# Patient Record
Sex: Male | Born: 1975 | Race: Black or African American | Hispanic: No | Marital: Married | Smoking: Never smoker
Health system: Southern US, Community
[De-identification: ages and names within clinical notes are randomized; demographics above are authoritative.]

## PROBLEM LIST (undated history)

## (undated) ENCOUNTER — Emergency Department (HOSPITAL_COMMUNITY): Discharge status: Absent without leave | Payer: Self-pay

## (undated) DIAGNOSIS — I10 Essential (primary) hypertension: Secondary | ICD-10-CM

## (undated) DIAGNOSIS — R001 Bradycardia, unspecified: Secondary | ICD-10-CM

## (undated) HISTORY — DX: Bradycardia, unspecified: R00.1

## (undated) HISTORY — DX: Essential (primary) hypertension: I10

---

## 2000-03-13 ENCOUNTER — Emergency Department (HOSPITAL_COMMUNITY): Admission: EM | Admit: 2000-03-13 | Discharge: 2000-03-13 | Payer: Self-pay

## 2012-10-25 ENCOUNTER — Other Ambulatory Visit: Payer: Self-pay | Admitting: Family Medicine

## 2012-10-25 ENCOUNTER — Ambulatory Visit (INDEPENDENT_AMBULATORY_CARE_PROVIDER_SITE_OTHER): Payer: 59 | Admitting: Family Medicine

## 2012-10-25 VITALS — BP 143/91 | HR 54 | Temp 98.1°F | Resp 16 | Ht 66.5 in | Wt 180.6 lb

## 2012-10-25 DIAGNOSIS — R1030 Lower abdominal pain, unspecified: Secondary | ICD-10-CM

## 2012-10-25 DIAGNOSIS — R109 Unspecified abdominal pain: Secondary | ICD-10-CM

## 2012-10-25 DIAGNOSIS — Z0000 Encounter for general adult medical examination without abnormal findings: Secondary | ICD-10-CM

## 2012-10-25 LAB — POCT CBC
Granulocyte percent: 57.9 % (ref 37–80)
Hemoglobin: 16 g/dL (ref 14.1–18.1)
Lymph, poc: 1.7 (ref 0.6–3.4)
MCV: 71.9 fL — AB (ref 80–97)
MID (cbc): 0.4 (ref 0–0.9)
MPV: 9.6 fL (ref 0–99.8)
POC Granulocyte: 2.8 (ref 2–6.9)
POC LYMPH PERCENT: 34.5 % (ref 10–50)
POC MID %: 7.6 % (ref 0–12)
RDW, POC: 15.4 %
WBC: 4.8 10*3/uL (ref 4.6–10.2)

## 2012-10-25 LAB — LIPID PANEL
Cholesterol: 183 mg/dL (ref 0–200)
HDL: 49 mg/dL (ref >39)
LDL Cholesterol: 118 mg/dL — ABNORMAL HIGH (ref 0–99)
Total CHOL/HDL Ratio: 3.7 Ratio
Triglycerides: 81 mg/dL (ref <150)
VLDL: 16 mg/dL (ref 0–40)

## 2012-10-25 LAB — COMPREHENSIVE METABOLIC PANEL WITH GFR
AST: 24 U/L (ref 0–37)
Albumin: 4.9 g/dL (ref 3.5–5.2)
Alkaline Phosphatase: 64 U/L (ref 39–117)
Glucose, Bld: 98 mg/dL (ref 70–99)
Potassium: 5.3 meq/L (ref 3.5–5.3)
Sodium: 140 meq/L (ref 135–145)
Total Bilirubin: 0.5 mg/dL (ref 0.3–1.2)
Total Protein: 7.4 g/dL (ref 6.0–8.3)

## 2012-10-25 LAB — COMPREHENSIVE METABOLIC PANEL
ALT: 24 U/L (ref 0–53)
BUN: 11 mg/dL (ref 6–23)
CO2: 28 mEq/L (ref 19–32)
Calcium: 9.3 mg/dL (ref 8.4–10.5)
Chloride: 103 mEq/L (ref 96–112)
Creat: 1.05 mg/dL (ref 0.50–1.35)

## 2012-10-25 NOTE — Progress Notes (Signed)
 [facility]:  Office Visit  Chief Complaint:  Chief Complaint  Patient presents with  . Annual Exam    HPI: Bill Morgan is a 37 y.o. male who complains of  Here for PE. Last week had a pulled  Right groin or hernia but it went away but hurts just a little bit. He was working at [employer], Research scientist (medical). Was like a dull pain, now gone, was constant. Wants to make sure he has no hernias  No other sxs, complaints. At work BP is always pre-hypertensive.  Past Medical History  Diagnosis Date  . Sinus bradycardia     asymptomatic, he has always had pulses from our office in the low 50s ( 53-54)   History reviewed. No pertinent past surgical history. History   Social History  . Marital Status: Single    Spouse Name: N/A    Number of Children: N/A  . Years of Education: N/A   Social History Main Topics  . Smoking status: Never Smoker   . Smokeless tobacco: None  . Alcohol Use: Yes  . Drug Use: No  . Sexually Active: Yes    Birth Control/ Protection: IUD   Other Topics Concern  . None   Social History Narrative  . None   History reviewed. No pertinent family history. No Known Allergies Prior to Admission medications   Not on File     ROS: The patient denies fevers, chills, night sweats, unintentional weight loss, chest pain, palpitations, wheezing, dyspnea on exertion, nausea, vomiting, abdominal pain, dysuria, hematuria, melena, numbness, weakness, or tingling.   All other systems have been reviewed and were otherwise negative with the exception of those mentioned in the HPI and as above.    PHYSICAL EXAM: Filed Vitals:   10/25/12 1121  BP: 143/91  Pulse: 54  Temp: 98.1 F (36.7 C)  Resp: 16   Filed Vitals:   10/25/12 1121  Height: 5' 6.5" (1.689 m)  Weight: 180 lb 9.6 oz (81.92 kg)   Body mass index is 28.72 kg/(m^2).  General: Alert, no acute distress HEENT:  Normocephalic, atraumatic, oropharynx patent. EOMI, PERRLA, fundoscpic  exam Cardiovascular:  Regular rate and rhythm, no rubs murmurs or gallops.  No Carotid bruits, radial pulse intact. No pedal edema.  Respiratory: Clear to auscultation bilaterally.  No wheezes, rales, or rhonchi.  No cyanosis, no use of accessory musculature GI: No organomegaly, abdomen is soft and non-tender, positive bowel sounds.  No masses. Skin: No rashes. Neurologic: Facial musculature symmetric. Psychiatric: Patient is appropriate throughout our interaction. Lymphatic: No cervical lymphadenopathy Musculoskeletal: Gait intact. Testicles normal, no masses No inguinal hernias, no abd/ventral hernias.     LABS: Results for orders placed in visit on 10/25/12  POCT CBC      Result Value Range   WBC 4.8  4.6 - 10.2 K/uL   Lymph, poc 1.7  0.6 - 3.4   POC LYMPH PERCENT 34.5  10 - 50 %L   MID (cbc) 0.4  0 - 0.9   POC MID % 7.6  0 - 12 %M   POC Granulocyte 2.8  2 - 6.9   Granulocyte percent 57.9  37 - 80 %G   RBC    4.69 - 6.13 M/uL   Hemoglobin 16.0  14.1 - 18.1 g/dL   HCT, POC    46.9 - 62.9 %   MCV 71.9 (*) 80 - 97 fL   MCH, POC    27 - 31.2 pg  MCHC    31.8 - 35.4 g/dL   RDW, POC 16.1     Platelet Count, POC    142 - 424 K/uL   MPV 9.6  0 - 99.8 fL     EKG/XRAY:   Primary read interpreted by Dr. Conley Rolls at [facility].   ASSESSMENT/PLAN: Encounter Diagnoses  Name Primary?  . Annual physical exam Yes  . Groin pain    No hernias Will return to office  in 2 weeks for repeat CBC lab only, no OV needed since today's CBC was abnormal Today's CBC will be sent out to see if [facility] vs our machine produce similar results. F/u in 2 week Annual Labs pending F/u as directed     ,  PHUONG, DO 10/25/2012 1:26 PM

## 2012-10-26 LAB — CBC WITH DIFFERENTIAL/PLATELET
Basophils Absolute: 0 10*3/uL (ref 0.0–0.1)
Basophils Relative: 1 % (ref 0–1)
Eosinophils Absolute: 0.1 10*3/uL (ref 0.0–0.7)
Eosinophils Relative: 2 % (ref 0–5)
HCT: 48.4 % (ref 39.0–52.0)
Hemoglobin: 16 g/dL (ref 13.0–17.0)
Lymphocytes Relative: 35 % (ref 12–46)
Lymphs Abs: 1.5 10*3/uL (ref 0.7–4.0)
MCH: 21.9 pg — ABNORMAL LOW (ref 26.0–34.0)
MCHC: 33.1 g/dL (ref 30.0–36.0)
MCV: 66.1 fL — ABNORMAL LOW (ref 78.0–100.0)
Monocytes Absolute: 0.3 10*3/uL (ref 0.1–1.0)
Monocytes Relative: 6 % (ref 3–12)
Neutro Abs: 2.5 10*3/uL (ref 1.7–7.7)
Neutrophils Relative %: 56 % (ref 43–77)
Platelets: 178 10*3/uL (ref 150–400)
RBC: 7.32 MIL/uL — ABNORMAL HIGH (ref 4.22–5.81)
RDW: 16.5 % — ABNORMAL HIGH (ref 11.5–15.5)
WBC: 4.3 10*3/uL (ref 4.0–10.5)

## 2012-10-27 ENCOUNTER — Telehealth: Payer: Self-pay | Admitting: Family Medicine

## 2012-10-27 DIAGNOSIS — R7989 Other specified abnormal findings of blood chemistry: Secondary | ICD-10-CM

## 2012-10-27 NOTE — Telephone Encounter (Signed)
LM that lab work was overall normal. However we had issues with his CBC in the office since the machine could not read some his RBCs , so was sent out. His MCV is low. I would like to return in 2 weeks and we will do a lab only visit to recheck his CBC.

## 2012-10-30 ENCOUNTER — Telehealth: Payer: Self-pay | Admitting: Family Medicine

## 2012-10-30 NOTE — Telephone Encounter (Signed)
error 

## 2015-10-12 ENCOUNTER — Emergency Department (HOSPITAL_COMMUNITY)
Admission: EM | Admit: 2015-10-12 | Discharge: 2015-10-12 | Disposition: A | Discharge status: Home / Self Care | Payer: 59 | Attending: Emergency Medicine | Admitting: Emergency Medicine

## 2015-10-12 ENCOUNTER — Encounter (HOSPITAL_COMMUNITY): Payer: Self-pay

## 2015-10-12 DIAGNOSIS — H109 Unspecified conjunctivitis: Secondary | ICD-10-CM

## 2015-10-12 DIAGNOSIS — R42 Dizziness and giddiness: Secondary | ICD-10-CM | POA: Diagnosis present

## 2015-10-12 DIAGNOSIS — H1031 Unspecified acute conjunctivitis, right eye: Secondary | ICD-10-CM | POA: Diagnosis not present

## 2015-10-12 LAB — I-STAT CHEM 8, ED
BUN: 11 mg/dL (ref 6–20)
CREATININE: 1.1 mg/dL (ref 0.61–1.24)
Calcium, Ion: 1.14 mmol/L (ref 1.12–1.23)
Chloride: 101 mmol/L (ref 101–111)
Glucose, Bld: 94 mg/dL (ref 65–99)
HEMATOCRIT: 54 % — AB (ref 39.0–52.0)
HEMOGLOBIN: 18.4 g/dL — AB (ref 13.0–17.0)
POTASSIUM: 4.2 mmol/L (ref 3.5–5.1)
SODIUM: 141 mmol/L (ref 135–145)
TCO2: 28 mmol/L (ref 0–100)

## 2015-10-12 MED ORDER — MOXIFLOXACIN HCL 0.5 % OP SOLN: 1.0000 [drp] | Freq: Three times a day (TID) | OPHTHALMIC | Status: DC

## 2015-10-12 MED ORDER — TETRACAINE HCL 0.5 % OP SOLN
2.0000 [drp] | Freq: Once | OPHTHALMIC | Status: AC
  Administered 2015-10-12: 2 [drp] via OPHTHALMIC
  Filled 2015-10-12: qty 4

## 2015-10-12 MED ORDER — FLUORESCEIN SODIUM 1 MG OP STRP
1.0000 | ORAL_STRIP | Freq: Once | OPHTHALMIC | Status: AC
  Administered 2015-10-12: 1 via OPHTHALMIC
  Filled 2015-10-12: qty 1

## 2015-10-12 MED ORDER — ONDANSETRON 8 MG PO TBDP
8.0000 mg | ORAL_TABLET | Freq: Once | ORAL | Status: AC
  Administered 2015-10-12: 8 mg via ORAL
  Filled 2015-10-12: qty 1

## 2015-10-12 NOTE — Discharge Instructions (Signed)
Use your eyedrops as prescribed. Do not wear your contact lenses for 2 weeks, discard them if they are disposable. Follow-up with your doctor for reevaluation. Return to ED for new or worsening symptoms.

## 2015-10-12 NOTE — ED Provider Notes (Signed)
CSN: [id number]     Arrival date & time 10/12/15  0730 History   First MD Initiated Contact with Patient 10/12/15 0741     No chief complaint on file.    (Consider location/radiation/quality/duration/timing/severity/associated sxs/prior Treatment) HPI Bill Morgan is a 40 y.o. male who comes in for evaluation of dizziness. Patient reports symptoms started a couple days ago when his contact was bothering him. States he took his contacts out and has not been wearing them. Does not have any other corrective lenses to wear. States when he lays down flat he gets a sensation that "the room is spinning". This resolves when he sits up or walks. No other interventions try to improve symptoms. Denies any other fevers, chills, numbness or weakness, headache or other vision changes, tinnitus. No history of hypertension, hyperlipidemia, diabetes, nonsmoker, no family history of heart disease.  Past Medical History  Diagnosis Date  . Sinus bradycardia     asymptomatic, he has always had pulses from our office in the low 50s ( 53-54)   No past surgical history on file. No family history on file. Social History  Substance Use Topics  . Smoking status: Never Smoker   . Smokeless tobacco: None  . Alcohol Use: Yes    Review of Systems  A 10 point review of systems was completed and was negative except for pertinent positives and negatives as mentioned in the history of present illness    Allergies  Review of patient's allergies indicates no known allergies.  Home Medications   Prior to Admission medications   Medication Sig Start Date End Date Taking? Authorizing Provider  Pseudoephedrine-APAP-DM (DAYQUIL MULTI-SYMPTOM COLD/FLU PO) Take 30 mLs by mouth daily as needed (cold.).   Yes Historical Provider, MD  Tetrahydrozoline HCl (VISINE OP) Apply 1 drop to eye daily as needed (dry eyes).   Yes Historical Provider, MD  moxifloxacin (VIGAMOX) 0.5 % ophthalmic solution Apply 1 drop to eye 3 (three)  times daily. Place one drop in affected eye 3 times daily for 7 days 10/12/15   Joycie PeekBenjamin Sheana Bir, PA-C   BP 138/109 mmHg  Temp(Src) 97.9 F (36.6 C) (Oral)  Resp 16  SpO2 100% Physical Exam  Constitutional: He is oriented to person, place, and time. He appears well-developed and well-nourished.  HENT:  Head: Normocephalic and atraumatic.  Mouth/Throat: Oropharynx is clear and moist.  Eyes: Pupils are equal, round, and reactive to light. Right eye exhibits no discharge. Left eye exhibits no discharge. No scleral icterus.  Right eye conjunctivitis. Pupils are equal, round and reactive. No consensual eye pain. Extraocular movements intact without nystagmus or pain. No other orbital erythema or tenderness. Upon fluorescein staining, no obvious corneal abrasion or dendritic lesions. No Seidel sign, foreign bodies.  Neck: Normal range of motion. Neck supple.  Cardiovascular: Normal rate, regular rhythm and normal heart sounds.   Pulmonary/Chest: Effort normal and breath sounds normal. No respiratory distress. He has no wheezes. He has no rales.  Abdominal: Soft. There is no tenderness.  Musculoskeletal: He exhibits no tenderness.  Neurological: He is alert and oriented to person, place, and time.  Cranial Nerves II-XII grossly intact. Motor strength and sensation are intact and baseline for patient. Gait is baseline. No ataxia  Skin: Skin is warm and dry. No rash noted.  Psychiatric: He has a normal mood and affect.  Nursing note and vitals reviewed.   ED Course  Procedures (including critical care time) Labs Review Labs Reviewed  I-STAT CHEM 8, ED - Abnormal;  Notable for the following:    Hemoglobin 18.4 (*)    HCT 54.0 (*)    All other components within normal limits    Imaging Review No results found. I have personally reviewed and evaluated these images and lab results as part of my medical decision-making.   EKG Interpretation None       Visual Acuity  Right Eye  Distance:   Left Eye Distance:   Bilateral Distance:    Right Eye Near:   Left Eye Near:    Bilateral Near:   (Pt states unable to see any of the letters)   Meds given in ED:  Medications  fluorescein ophthalmic strip 1 strip (1 strip Right Eye Given 10/12/15 0819)  tetracaine (PONTOCAINE) 0.5 % ophthalmic solution 2 drop (2 drops Right Eye Given 10/12/15 0819)  ondansetron (ZOFRAN-ODT) disintegrating tablet 8 mg (8 mg Oral Given 10/12/15 0832)    New Prescriptions   MOXIFLOXACIN (VIGAMOX) 0.5 % OPHTHALMIC SOLUTION    Apply 1 drop to eye 3 (three) times daily. Place one drop in affected eye 3 times daily for 7 days   Filed Vitals:   10/12/15 0737  BP: 138/109  Temp: 97.9 F (36.6 C)  TempSrc: Oral  Resp: 16  SpO2: 100%     MDM  No dizziness in emergency department, patient states symptoms have resolved. He does have a right-sided conjunctivitis. Otherwise unremarkable exam. Labs not concerning. Patient does wear contact lenses, will treat with moxifloxacin ophthalmic. Unable to complete visual acuity secondary to not wearing contacts. Mother at bedside is driving patient. Discussed follow-up with PCP, return precautions. Overall, he appears very well, nontoxic, afebrile and hemodynamically stable. Appropriate for discharge. Final diagnoses:  Conjunctivitis of right eye        Joycie Peek, PA-C 10/12/15 1914  Leta Baptist, MD 10/13/15 [phone]

## 2017-11-01 ENCOUNTER — Ambulatory Visit: Payer: Self-pay | Admitting: Physician Assistant

## 2018-05-14 ENCOUNTER — Encounter (HOSPITAL_COMMUNITY): Payer: Self-pay | Admitting: Emergency Medicine

## 2018-05-14 ENCOUNTER — Emergency Department (HOSPITAL_COMMUNITY)
Admission: EM | Admit: 2018-05-14 | Discharge: 2018-05-14 | Disposition: A | Discharge status: Home / Self Care | Payer: BLUE CROSS/BLUE SHIELD | Attending: Emergency Medicine | Admitting: Emergency Medicine

## 2018-05-14 ENCOUNTER — Emergency Department (HOSPITAL_COMMUNITY): Payer: BLUE CROSS/BLUE SHIELD

## 2018-05-14 ENCOUNTER — Other Ambulatory Visit: Payer: Self-pay

## 2018-05-14 DIAGNOSIS — R002 Palpitations: Secondary | ICD-10-CM | POA: Insufficient documentation

## 2018-05-14 DIAGNOSIS — R0789 Other chest pain: Secondary | ICD-10-CM | POA: Diagnosis present

## 2018-05-14 DIAGNOSIS — Z79899 Other long term (current) drug therapy: Secondary | ICD-10-CM | POA: Diagnosis not present

## 2018-05-14 LAB — BASIC METABOLIC PANEL
Anion gap: 9 (ref 5–15)
BUN: 8 mg/dL (ref 6–20)
CHLORIDE: 102 mmol/L (ref 98–111)
CO2: 28 mmol/L (ref 22–32)
CREATININE: 1.21 mg/dL (ref 0.61–1.24)
Calcium: 9.4 mg/dL (ref 8.9–10.3)
GFR calc Af Amer: >60 mL/min (ref >60)
GFR calc non Af Amer: >60 mL/min (ref >60)
GLUCOSE: 143 mg/dL — AB (ref 70–99)
POTASSIUM: 4.3 mmol/L (ref 3.5–5.1)
Sodium: 139 mmol/L (ref 135–145)

## 2018-05-14 LAB — CBC
HCT: 50.8 % (ref 39.0–52.0)
HEMOGLOBIN: 15 g/dL (ref 13.0–17.0)
MCH: 20.5 pg — AB (ref 26.0–34.0)
MCHC: 29.5 g/dL — AB (ref 30.0–36.0)
MCV: 69.6 fL — AB (ref 80.0–100.0)
Platelets: 190 10*3/uL (ref 150–400)
RBC: 7.3 MIL/uL — ABNORMAL HIGH (ref 4.22–5.81)
RDW: 17.7 % — AB (ref 11.5–15.5)
WBC: 7.2 10*3/uL (ref 4.0–10.5)
nRBC: 0 % (ref 0.0–0.2)

## 2018-05-14 LAB — I-STAT TROPONIN, ED: Troponin i, poc: 0 ng/mL (ref 0.00–0.08)

## 2018-05-14 LAB — TSH: TSH: 0.919 u[IU]/mL (ref 0.350–4.500)

## 2018-05-14 NOTE — ED Provider Notes (Signed)
[facility] EMERGENCY DEPARTMENT Provider Note   CSN: [id number] Arrival date & time: 05/14/18  1227     History   Chief Complaint Chief Complaint  Patient presents with  . Chest Pain  . Shortness of Breath    HPI Bill Morgan J Fiske is a 42 y.o. male.  HPI Patient is a 42 year old male presents emergency department with acute onset palpitations today.  He had similar symptoms 3 days ago while at home.  He develops palpitations with some associated chest tightness and then his symptoms resolve after less than a minute.  No fevers or chills.  Denies abdominal pain.  No chest discomfort at this time.  No palpitations.  No change in his medications.  Denies excessive caffeine intake.  No other over-the-counter medications.  Currently asymptomatic.  No prior history of SVT or atrial fibrillation.  Is scheduled to see a new primary care physician in the coming weeks.  He went to an urgent care today and was sent to the emergency department for additional work-up.   Past Medical History:  Diagnosis Date  . Sinus bradycardia    asymptomatic, he has always had pulses from our office in the low 50s ( 53-54)    There are no active problems to display for this patient.   History reviewed. No pertinent surgical history.      Home Medications    Prior to Admission medications   Medication Sig Start Date End Date Taking? Authorizing Provider  ibuprofen (ADVIL,MOTRIN) 200 MG tablet Take 200 mg by mouth every 6 (six) hours as needed for mild pain.   Yes [provider]    Family History No family history on file.  Social History Social History   Tobacco Use  . Smoking status: Never Smoker  Substance Use Topics  . Alcohol use: Yes  . Drug use: No     Allergies   Patient has no known allergies.   Review of Systems Review of Systems  All other systems reviewed and are negative.    Physical Exam Updated Vital Signs BP (!) 133/92   Pulse 62    Temp 98.6 F (37 C) (Oral)   Resp 15   Ht 5\' 7"  (1.702 m)   Wt 91.2 kg   SpO2 100%   BMI 31.48 kg/m   Physical Exam Vitals signs and nursing note reviewed.  Constitutional:      Appearance: He is well-developed.  HENT:     Head: Normocephalic and atraumatic.  Neck:     Musculoskeletal: Normal range of motion.  Cardiovascular:     Rate and Rhythm: Normal rate and regular rhythm.     Heart sounds: Normal heart sounds. No murmur.  Pulmonary:     Effort: Pulmonary effort is normal. No respiratory distress.     Breath sounds: Normal breath sounds.  Abdominal:     General: There is no distension.     Palpations: Abdomen is soft.     Tenderness: There is no abdominal tenderness.  Musculoskeletal: Normal range of motion.  Skin:    General: Skin is warm and dry.  Neurological:     Mental Status: He is alert and oriented to person, place, and time.  Psychiatric:        Judgment: Judgment normal.      ED Treatments / Results  Labs (all labs ordered are listed, but only abnormal results are displayed) Labs Reviewed  BASIC METABOLIC PANEL - Abnormal; Notable for the following components:  Result Value   Glucose, Bld 143 (*)    All other components within normal limits  CBC - Abnormal; Notable for the following components:   RBC 7.30 (*)    MCV 69.6 (*)    MCH 20.5 (*)    MCHC 29.5 (*)    RDW 17.7 (*)    All other components within normal limits  TSH  I-STAT TROPONIN, ED    EKG EKG Interpretation  Date/Time:  Wednesday May 14 2018 12:34:43 EST Ventricular Rate:  68 PR Interval:    QRS Duration: 104 QT Interval:  380 QTC Calculation: 405 R Axis:   37 Text Interpretation:  Sinus rhythm RSR' in V1 or V2, right VCD or RVH No significant change was found Confirmed by Azalia Bilis ([id number]) on 05/14/2018 2:00:05 PM   Radiology Dg Chest 2 View  Result Date: 05/14/2018 CLINICAL DATA:  Chest pain, shortness of breath EXAM: CHEST - 2 VIEW COMPARISON:  None.  FINDINGS: The heart size and mediastinal contours are within normal limits. Both lungs are clear. The visualized skeletal structures are unremarkable. IMPRESSION: No active cardiopulmonary disease. Electronically Signed   By: Elige Ko   On: 05/14/2018 13:30    Procedures Procedures (including critical care time)  Medications Ordered in ED Medications - No data to display   Initial Impression / Assessment and Plan / ED Course  I have reviewed the triage vital signs and the nursing notes.  Pertinent labs & imaging results that were available during my care of the patient were reviewed by me and considered in my medical decision making (see chart for details).     Work-up in the emergency department is without significant abnormality.  Telemetry was reviewed and no ectopy or arrhythmia was noted.  He is developing transient palpitations with associated symptoms after developing palpitations.  This is likely A. fib with RVR or SVT.  His resting heart rate is in the high 50s therefore I do not think he is a great candidate for going home with beta-blocker therapy.  He will need outpatient cardiology follow-up and Holter monitor.  He understands to return to the ER for new or worsening symptoms.  No indication for emergent consultation.  No indication for acute hospitalization or additional stat work-up at this time.  Stable for discharge to follow-up with cardiology.   Final Clinical Impressions(s) / ED Diagnoses   Final diagnoses:  Palpitations    ED Discharge Orders    None       Azalia Bilis, MD 05/14/18 [phone]

## 2018-05-14 NOTE — ED Notes (Signed)
Pt verbalized discharge paperwork and follow-up care.  

## 2018-05-14 NOTE — ED Triage Notes (Signed)
Pt reports CP and SOB that started Sunday. Pt was at urgent care and sent here for EKG. Pt reports intermittent tightness to left side of chest and neck. Denies any N/V, weakness or dizziness.

## 2018-05-14 NOTE — ED Notes (Signed)
Patient transported to X-ray 

## 2018-05-15 ENCOUNTER — Ambulatory Visit: Payer: BLUE CROSS/BLUE SHIELD | Admitting: Internal Medicine

## 2018-05-15 ENCOUNTER — Encounter: Payer: Self-pay | Admitting: Internal Medicine

## 2018-05-15 VITALS — BP 140/90 | HR 62 | Ht 68.0 in | Wt 197.8 lb

## 2018-05-15 DIAGNOSIS — R002 Palpitations: Secondary | ICD-10-CM

## 2018-05-15 NOTE — Patient Instructions (Signed)
Medication Instructions:  Not needed If you need a refill on your cardiac medications before your next appointment, please call your pharmacy.   Lab work: Not needed If you have labs (blood work) drawn today and your tests are completely normal, you will receive your results only by: Marland Kitchen. MyChart Message (if you have MyChart) OR . A paper copy in the mail If you have any lab test that is abnormal or we need to change your treatment, we will call you to review the results.  Testing/Procedures: Schedule at [id number] Your physician has recommended that you wear a holter monitor 14 DAY ZIO - PALP.Marland Kitchen. Holter monitors are medical devices that record the heart's electrical activity. Doctors most often use these monitors to diagnose arrhythmias. Arrhythmias are problems with the speed or rhythm of the heartbeat. The monitor is a small, portable device. You can wear one while you do your normal daily activities. This is usually used to diagnose what is causing palpitations/syncope (passing out).    Follow-Up: At [facility], you and your health needs are our priority.  As part of our continuing mission to provide you with exceptional heart care, we have created designated Provider Care Teams.  These Care Teams include your primary Cardiologist (physician) and Advanced Practice Providers (APPs -  Physician Assistants and Nurse Practitioners) who all work together to provide you with the care you need, when you need it. . Your physician recommends that you schedule a follow-up appointment in 1 MONTH WITH DR HILTY .   Any Other Special Instructions Will Be Listed Below (If Applicable). NOT NEEDED

## 2018-05-15 NOTE — Progress Notes (Signed)
OFFICE CONSULT NOTE  Chief Complaint:  Palpitations  Primary Care Physician: Patient, No Pcp Per  HPI:  Bill Morgan is a 42 y.o. male who is being seen today for the evaluation of palpitations at the request of No ref. provider found.  Bill Morgan is a 42 year old male who was seen in the ER yesterday for palpitations.  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  He was getting to work and in the car noted that his heart started racing.  Did not stop immediately when he got to work and his supervisor thought that he probably should not drive.  He recommended going to the emergency department.  He was assessed at an urgent care and found to have negative troponins.  EKG did not show any significant abnormalities but his symptoms had resolved by that time.  He denies any associated chest pain with this.  He has had recently some palpitations but they were much shorter lived and less intense.  It happened over the summer might of been associated with stress after the death of a relative.  PMHx:  Past Medical History:  Diagnosis Date  . Sinus bradycardia    asymptomatic, he has always had pulses from our office in the low 50s ( 53-54)    No past surgical history on file.  FAMHx:  No family history on file.  No significant history of coronary artery disease.  SOCHx:   reports that he has never smoked. He does not have any smokeless tobacco history on file. He reports current alcohol use. He reports that he does not use drugs.  ALLERGIES:  No Known Allergies  ROS: Pertinent items noted in HPI and remainder of comprehensive ROS otherwise negative.  HOME MEDS: Current Outpatient Medications on File Prior to Visit  Medication Sig Dispense Refill  . ibuprofen (ADVIL,MOTRIN) 200 MG tablet Take 200 mg by mouth every 6 (six) hours as needed for mild pain.     No current facility-administered medications on file prior to visit.     LABS/IMAGING: Results for orders  placed or performed during the hospital encounter of 05/14/18 (from the past 48 hour(s))  Basic metabolic panel     Status: Abnormal   Collection Time: 05/14/18 12:38 PM  Result Value Ref Range   Sodium 139 135 - 145 mmol/L   Potassium 4.3 3.5 - 5.1 mmol/L   Chloride 102 98 - 111 mmol/L   CO2 28 22 - 32 mmol/L   Glucose, Bld 143 (H) 70 - 99 mg/dL   BUN 8 6 - 20 mg/dL   Creatinine, Ser 4.691.21 0.61 - 1.24 mg/dL   Calcium 9.4 8.9 - 62.910.3 mg/dL   GFR calc non Af Amer >60 >60 mL/min   GFR calc Af Amer >60 >60 mL/min   Anion gap 9 5 - 15    Comment: Performed at [facility], [address] [place], [place] [address]  CBC     Status: Abnormal   Collection Time: 05/14/18 12:38 PM  Result Value Ref Range   WBC 7.2 4.0 - 10.5 K/uL   RBC 7.30 (H) 4.22 - 5.81 MIL/uL   Hemoglobin 15.0 13.0 - 17.0 g/dL   HCT 32.450.8 40.139.0 - 02.752.0 %   MCV 69.6 (L) 80.0 - 100.0 fL   MCH 20.5 (L) 26.0 - 34.0 pg   MCHC 29.5 (L) 30.0 - 36.0 g/dL   RDW 25.317.7 (H) 66.411.5 - 40.315.5 %   Platelets 190 150 -  400 K/uL   nRBC 0.0 0.0 - 0.2 %    Comment: Performed at [facility], [address], [place], [place] [address]  I-stat troponin, ED     Status: None   Collection Time: 05/14/18 12:59 PM  Result Value Ref Range   Troponin i, poc 0.00 0.00 - 0.08 ng/mL   Comment 3            Comment: Due to the release kinetics of cTnI, a negative result within the first hours of the onset of symptoms does not rule out myocardial infarction with certainty. If myocardial infarction is still suspected, repeat the test at appropriate intervals.   TSH     Status: None   Collection Time: 05/14/18  1:10 PM  Result Value Ref Range   TSH 0.919 0.350 - 4.500 uIU/mL    Comment: Performed by a 3rd Generation assay with a functional sensitivity of <=0.01 uIU/mL. Performed at [facility], [address], [place], [place] [address]    Dg Chest 2 View  Result Date: 05/14/2018 CLINICAL DATA:  Chest pain, shortness of breath  EXAM: CHEST - 2 VIEW COMPARISON:  None. FINDINGS: The heart size and mediastinal contours are within normal limits. Both lungs are clear. The visualized skeletal structures are unremarkable. IMPRESSION: No active cardiopulmonary disease. Electronically Signed   By: Elige Ko   On: 05/14/2018 13:30    LIPID PANEL:    Component Value Date/Time   CHOL 183 10/25/2012 1322   TRIG 81 10/25/2012 1322   HDL 49 10/25/2012 1322   CHOLHDL 3.7 10/25/2012 1322   VLDL 16 10/25/2012 1322   LDLCALC 118 (H) 10/25/2012 1322    WEIGHTS: Wt Readings from Last 3 Encounters:  05/15/18 197 lb 12.8 oz (89.7 kg)  05/14/18 201 lb (91.2 kg)  10/25/12 180 lb 9.6 oz (81.9 kg)    VITALS: BP 140/90   Pulse 62   Ht 5\' 8"  (1.727 m)   Wt 197 lb 12.8 oz (89.7 kg)   BMI 30.08 kg/m   EXAM: General appearance: alert and no distress Neck: no carotid bruit, no JVD and thyroid not enlarged, symmetric, no tenderness/mass/nodules Lungs: clear to auscultation bilaterally Heart: regular rate and rhythm, S1, S2 normal, no murmur, click, rub or gallop Abdomen: soft, non-tender; bowel sounds normal; no masses,  no organomegaly Extremities: extremities normal, atraumatic, no cyanosis or edema Pulses: 2+ and symmetric Skin: Skin color, texture, turgor normal. No rashes or lesions Neurologic: Grossly normal Psych: Pleasant  EKG: Normal sinus with normal axes and intervals (ER, 12/18) - personally reviewed  ASSESSMENT: 1. Palpitations  PLAN: 1.   Bill Morgan is describing palpitations.  His symptoms lasted only for minutes and resolve spontaneously.  He has had some other episodes.  It is difficult to predict the frequency of these episodes and therefore I would like to monitor him for period of time, likely 2 weeks with a ZIO patch.  He denies any chest pain with this.  He is very physically active and is asymptomatic otherwise.  We will plan to see him back for follow-up and discuss the results of his monitor and  may consider pharmacotherapy at that point.  He reports very little caffeine use, stimulants or other issues with his diet.  He does have difficulty sleeping in the sense that he is more insomniac rather than hypersomnolent.  He does snore, but his wife has not noted any apnea and generally his energy level is good.  Follow-up after his monitor.  Bill NoseKenneth C. Priti Consoli, Bill Morgan, Bill Morgan LLCFACC, FACP  [place]  [facility]  Medical Director of the [facility]  [facility] Diplomate of the American Board of Clinical Lipidology Attending Cardiologist  Direct Dial: [phone]  Fax: [phone]  Website:  [email or web address]  Bill Morgan 05/15/2018, 3:27 PM

## 2018-05-29 ENCOUNTER — Ambulatory Visit (INDEPENDENT_AMBULATORY_CARE_PROVIDER_SITE_OTHER): Payer: BLUE CROSS/BLUE SHIELD

## 2018-05-29 DIAGNOSIS — R002 Palpitations: Secondary | ICD-10-CM

## 2018-06-30 ENCOUNTER — Ambulatory Visit: Payer: BLUE CROSS/BLUE SHIELD | Admitting: Internal Medicine

## 2018-06-30 ENCOUNTER — Encounter: Payer: Self-pay | Admitting: Internal Medicine

## 2018-06-30 VITALS — BP 135/87 | HR 62 | Ht 67.0 in | Wt 194.4 lb

## 2018-06-30 DIAGNOSIS — R002 Palpitations: Secondary | ICD-10-CM | POA: Diagnosis not present

## 2018-06-30 NOTE — Patient Instructions (Signed)
Medication Instructions:  Continue current medications If you need a refill on your cardiac medications before your next appointment, please call your pharmacy.    Follow-Up: As needed with Dr. Hilty   

## 2018-06-30 NOTE — Progress Notes (Signed)
OFFICE CONSULT NOTE  Chief Complaint:  Palpitations  Primary Care Physician: Irena Reichmann, DO  HPI:  Bill Morgan is a 43 y.o. male who is being seen today for the evaluation of palpitations at the request of Dr. Thomasena Edis.  Bill Morgan is a 43 year old male who was seen in the ER yesterday for palpitations.  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  He was getting to work and in the car noted that his heart started racing.  Did not stop immediately when he got to work and his supervisor thought that he probably should not drive.  He recommended going to the emergency department.  He was assessed at an urgent care and found to have negative troponins.  EKG did not show any significant abnormalities but his symptoms had resolved by that time.  He denies any associated chest pain with this.  He has had recently some palpitations but they were much shorter lived and less intense.  It happened over the summer might of been associated with stress after the death of a relative.  July 01, 2018  Bill Morgan returns today for follow-up. He wore a 2 week Zio monitor which showed isolated PAC's and PVC's and did report symptoms, that did not seem to correlate with this. There was occasional sinus tachycardia with was exertional. Overall, no clear finding to support his symptoms of palpitations. He says they are a little better. Stress at work is a little less as well.  PMHx:  Past Medical History:  Diagnosis Date  . Sinus bradycardia    asymptomatic, he has always had pulses from our office in the low 50s ( 53-54)    No past surgical history on file.  FAMHx:  No family history on file.  No significant history of coronary artery disease.  SOCHx:   reports that he has never smoked. He has never used smokeless tobacco. He reports current alcohol use. He reports that he does not use drugs.  ALLERGIES:  No Known Allergies  ROS: Pertinent items noted in HPI and remainder of  comprehensive ROS otherwise negative.  HOME MEDS: Current Outpatient Medications on File Prior to Visit  Medication Sig Dispense Refill  . amLODipine (NORVASC) 5 MG tablet Take 1 tablet by mouth daily.    Marland Kitchen ibuprofen (ADVIL,MOTRIN) 200 MG tablet Take 200 mg by mouth every 6 (six) hours as needed for mild pain.     No current facility-administered medications on file prior to visit.     LABS/IMAGING: No results found for this or any previous visit (from the past 48 hour(s)). No results found.  LIPID PANEL:    Component Value Date/Time   CHOL 183 10/25/2012 1322   TRIG 81 10/25/2012 1322   HDL 49 10/25/2012 1322   CHOLHDL 3.7 10/25/2012 1322   VLDL 16 10/25/2012 1322   LDLCALC 118 (H) 10/25/2012 1322    WEIGHTS: Wt Readings from Last 3 Encounters:  07-01-2018 194 lb 6.4 oz (88.2 kg)  05/15/18 197 lb 12.8 oz (89.7 kg)  05/14/18 201 lb (91.2 kg)    VITALS: BP 135/87   Pulse 62   Ht 5\' 7"  (1.702 m)   Wt 194 lb 6.4 oz (88.2 kg)   BMI 30.45 kg/m   EXAM: Deferred  EKG: Deferred  ASSESSMENT: 1. Palpitations  PLAN: 1.   Bill Morgan had no significant abnormal findings on his monitor except for isolated PAC's and PVC's. I think his symptoms may be due to stress. Would  continue healthy lifestyle choices, avoid stimulants and and continue aerobic exercise. Follow-up with me as needed.  Chrystie Nose, MD, [facility], FACP  [place]  [facility]  Medical Director of the [facility]  [facility] Diplomate of the American Board of Clinical Lipidology Attending Cardiologist  Direct Dial: [phone]  Fax: [phone]  Website:  [email or web address] 06/30/2018, 9:36 AM

## 2020-03-12 IMAGING — CR DG CHEST 2V
2 series · 2 of 2 positions shown · non-contrast
Comparison: None.

CLINICAL DATA: Chest pain, shortness of breath

EXAM:
CHEST - 2 VIEW

[chest pa]
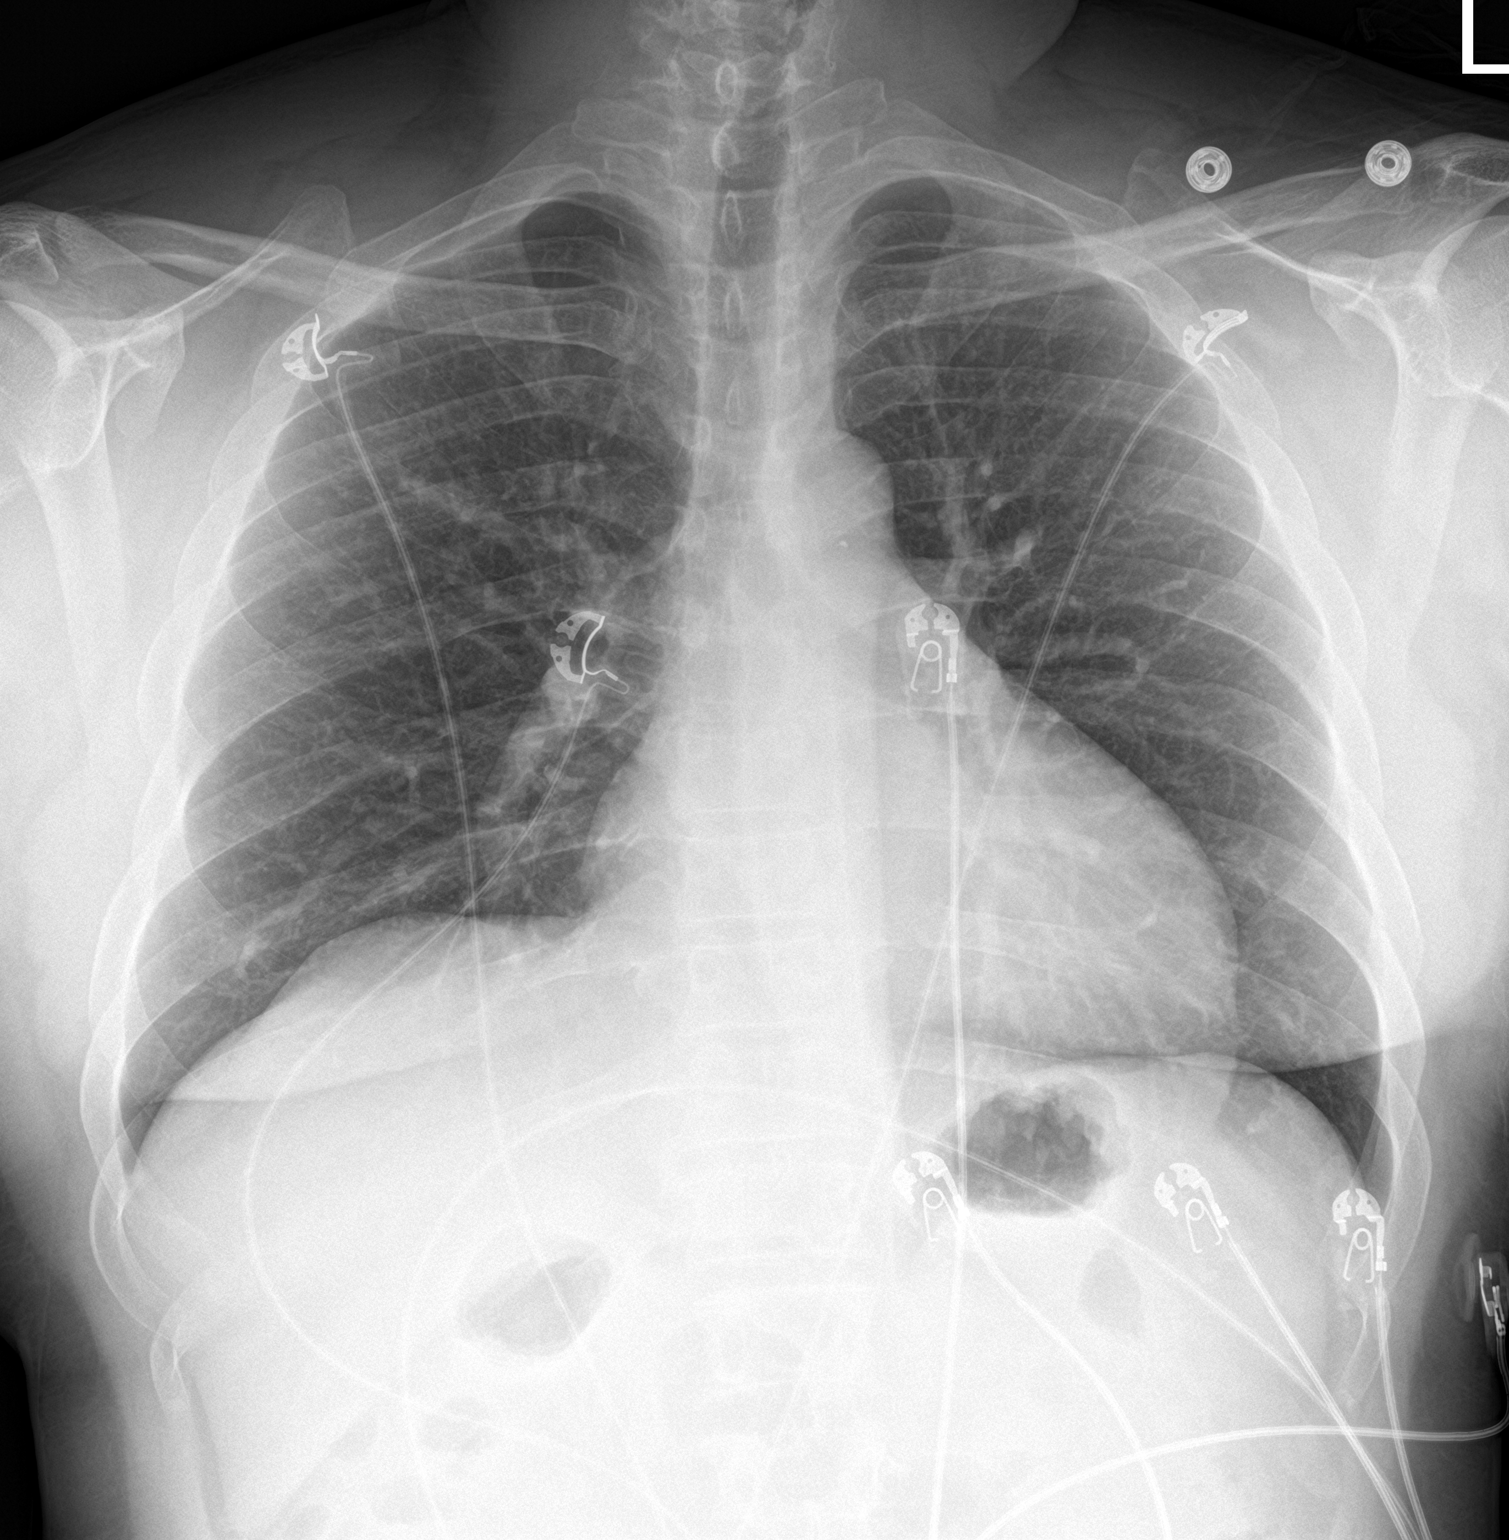

[chest lat]
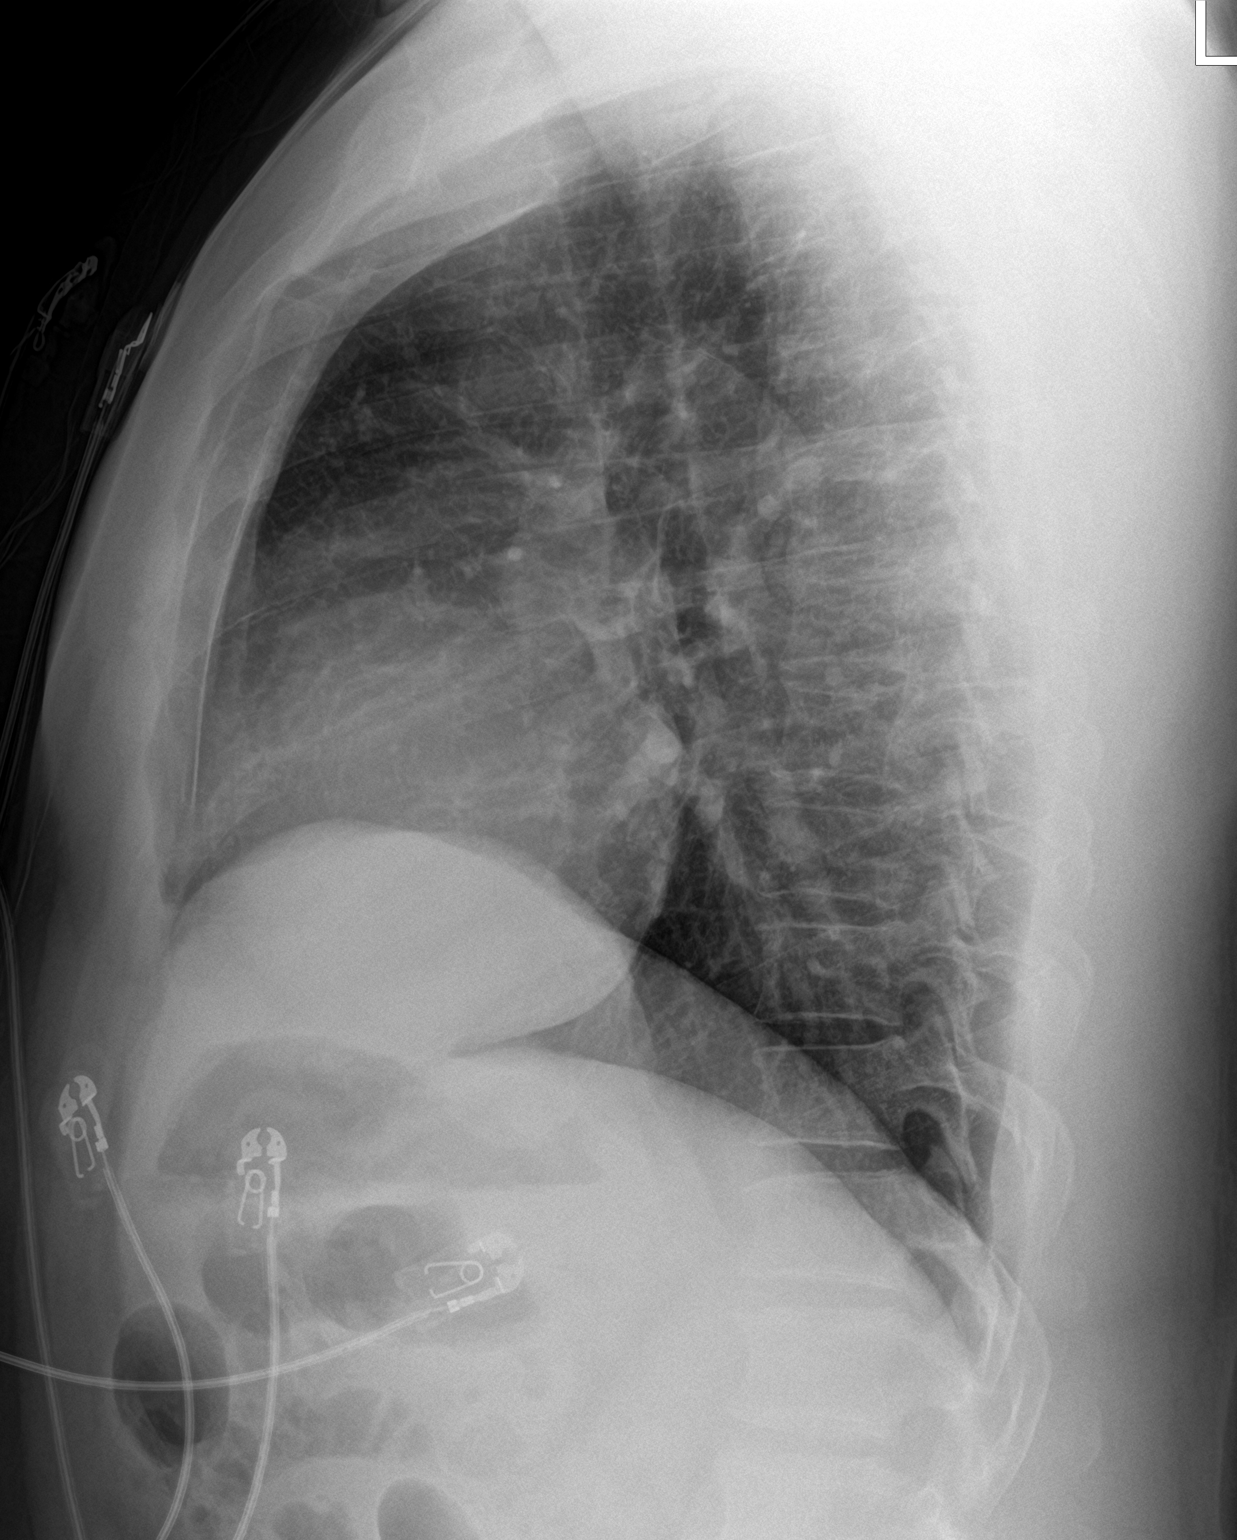

[2 of 2 positions shown; findings below may reference images not displayed]

FINDINGS: The heart size and mediastinal contours are within normal limits.
Both lungs are clear. The visualized skeletal structures are
unremarkable.
IMPRESSION: No active cardiopulmonary disease.

## 2024-06-08 ENCOUNTER — Encounter: Payer: Self-pay | Admitting: Hematology and Oncology

## 2024-06-08 ENCOUNTER — Inpatient Hospital Stay: Attending: Hematology and Oncology

## 2024-06-08 ENCOUNTER — Inpatient Hospital Stay: Admitting: Hematology and Oncology

## 2024-06-08 VITALS — BP 131/90 | HR 55 | Temp 98.1°F | Resp 18 | Ht 67.5 in | Wt 187.4 lb

## 2024-06-08 DIAGNOSIS — R718 Other abnormality of red blood cells: Secondary | ICD-10-CM | POA: Insufficient documentation

## 2024-06-08 LAB — CBC WITH DIFFERENTIAL (CANCER CENTER ONLY)
Abs Immature Granulocytes: 0.02 K/uL (ref 0.00–0.07)
Basophils Absolute: 0.1 K/uL (ref 0.0–0.1)
Basophils Relative: 1 %
Eosinophils Absolute: 0.1 K/uL (ref 0.0–0.5)
Eosinophils Relative: 3 %
HCT: 47.8 % (ref 39.0–52.0)
Hemoglobin: 15.1 g/dL (ref 13.0–17.0)
Immature Granulocytes: 0 %
Lymphocytes Relative: 44 %
Lymphs Abs: 2.3 K/uL (ref 0.7–4.0)
MCH: 21.3 pg — ABNORMAL LOW (ref 26.0–34.0)
MCHC: 31.6 g/dL (ref 30.0–36.0)
MCV: 67.4 fL — ABNORMAL LOW (ref 80.0–100.0)
Monocytes Absolute: 0.3 K/uL (ref 0.1–1.0)
Monocytes Relative: 6 %
Neutro Abs: 2.4 K/uL (ref 1.7–7.7)
Neutrophils Relative %: 46 %
Platelet Count: 187 K/uL (ref 150–400)
RBC: 7.09 MIL/uL — ABNORMAL HIGH (ref 4.22–5.81)
RDW: 17.8 % — ABNORMAL HIGH (ref 11.5–15.5)
Smear Review: NORMAL
WBC Count: 5.3 K/uL (ref 4.0–10.5)
nRBC: 0 % (ref 0.0–0.2)

## 2024-06-08 LAB — FERRITIN: Ferritin: 125 ng/mL (ref 24–336)

## 2024-06-08 LAB — IRON AND IRON BINDING CAPACITY (CC-WL,HP ONLY)
Iron: 104 ug/dL (ref 45–182)
Saturation Ratios: 29 % (ref 17.9–39.5)
TIBC: 354 ug/dL (ref 250–450)
UIBC: 250 ug/dL

## 2024-06-08 NOTE — Progress Notes (Signed)
 [facility] CONSULT NOTE  Patient Care Team: Gerome Brunet, DO as PCP - General (Family Medicine) Mona Vinie BROCKS, MD as PCP - Cardiology (Cardiology)   ASSESSMENT & PLAN:  RBC microcytosis The patient has chronic microcytosis with elevated RBC count and low MCV, most consistent with undiagnosed thalassemia trait I will order additional workup We discussed inheritance pattern of thalassemia He does not need future follow-up I will call him with test results   Orders Placed This Encounter  Procedures   CBC with Differential (Cancer Center Only)    Standing Status:   Future    Number of Occurrences:   1    Expiration Date:   06/08/2025   Alpha-Thalassemia Analysis    Standing Status:   Future    Number of Occurrences:   1    Expiration Date:   06/08/2025    Indication:   microcytosis   Hgb Fractionation Cascade    Standing Status:   Future    Number of Occurrences:   1    Expiration Date:   06/08/2025   Iron and Iron Binding Capacity ([facility] only)    Standing Status:   Future    Number of Occurrences:   1    Expiration Date:   06/08/2025   Ferritin    Standing Status:   Future    Number of Occurrences:   1    Expiration Date:   06/08/2025    All questions were answered. The patient knows to call the clinic with any problems, questions or concerns. I spent 55 minutes counseling the patient face to face. The total time spent in the appointment was 55 minutes and more than 50% was on counseling.     Almarie Bedford, MD 06/08/2024 2:42 PM    CHIEF COMPLAINTS/PURPOSE OF CONSULTATION:  Thalassemia  HISTORY OF PRESENTING ILLNESS:  Bill Morgan 49 y.o. male is here because of abnormal CBC He is here accompanied by his mother The patient is otherwise healthy I have the opportunity to review his electronic records On Oct 25, 2012, CBC showed white count of 4.3, RBC count 7.32, hemoglobin 16, MCV 66.1 and RDW 16.5 On May 14, 2018, white count was 7.2,  hemoglobin 15 and platelet count 190, RBC count 7.3, MCV 69.6 On November 24, 20 25, white blood cell count 4.5, RBC 6.72, hemoglobin 14.4, MCV 72, RDW 17.6 and platelet count 213  He has never required blood transfusion support He is not aware of family history of abnormal blood disorder.  He does not know history on his father side of family MEDICAL HISTORY:  Past Medical History:  Diagnosis Date   Hypertension    Sinus bradycardia    asymptomatic, he has always had pulses from our office in the low 50s ( 53-54)    SURGICAL HISTORY: History reviewed. No pertinent surgical history.  SOCIAL HISTORY: Social History   Socioeconomic History   Marital status: Married    Spouse name: Not on file   Number of children: 2   Years of education: Not on file   Highest education level: Not on file  Occupational History   Occupation: [employer]  Tobacco Use   Smoking status: Never   Smokeless tobacco: Never  Vaping Use   Vaping status: Never Used  Substance and Sexual Activity   Alcohol use: Yes   Drug use: No   Sexual activity: Yes    Birth control/protection: I.U.D.  Other Topics Concern   Not on file  Social History Narrative   Not on file   Social Drivers of Health   Tobacco Use: Low Risk (06/08/2024)   Patient History    Smoking Tobacco Use: Never    Smokeless Tobacco Use: Never    Passive Exposure: Not on file  Financial Resource Strain: Not on file  Food Insecurity: Not on file  Transportation Needs: Not on file  Physical Activity: Not on file  Stress: Not on file  Social Connections: Not on file  Intimate Partner Violence: Not on file  Depression (EYV7-0): Not on file  Alcohol Screen: Not on file  Housing: Not on file  Utilities: Not on file  Health Literacy: Not on file    FAMILY HISTORY: History reviewed. No pertinent family history.  ALLERGIES:  has no known allergies.  MEDICATIONS:  Current Outpatient Medications  Medication Sig Dispense Refill    amLODipine (NORVASC) 5 MG tablet Take 1 tablet by mouth daily.     ibuprofen (ADVIL,MOTRIN) 200 MG tablet Take 200 mg by mouth every 6 (six) hours as needed for mild pain.     No current facility-administered medications for this visit.    REVIEW OF SYSTEMS:   Constitutional: Denies fevers, chills or abnormal night sweats Eyes: Denies blurriness of vision, double vision or watery eyes Ears, nose, mouth, throat, and face: Denies mucositis or sore throat Respiratory: Denies cough, dyspnea or wheezes Cardiovascular: Denies palpitation, chest discomfort or lower extremity swelling Gastrointestinal:  Denies nausea, heartburn or change in bowel habits Skin: Denies abnormal skin rashes Lymphatics: Denies new lymphadenopathy or easy bruising Neurological:Denies numbness, tingling or new weaknesses Behavioral/Psych: Mood is stable, no new changes  All other systems were reviewed with the patient and are negative.  PHYSICAL EXAMINATION: ECOG PERFORMANCE STATUS: 0 - Asymptomatic  Vitals:   06/08/24 1357  BP: (!) 131/90  Pulse: (!) 55  Resp: 18  Temp: 98.1 F (36.7 C)  SpO2: 100%   Filed Weights   06/08/24 1357  Weight: 187 lb 6.4 oz (85 kg)    GENERAL:alert, no distress and comfortable SKIN: skin color, texture, turgor are normal, no rashes or significant lesions EYES: normal, conjunctiva are pink and non-injected, sclera clear OROPHARYNX:no exudate, no erythema and lips, buccal mucosa, and tongue normal  NECK: supple, thyroid  normal size, non-tender, without nodularity LYMPH:  no palpable lymphadenopathy in the cervical, axillary or inguinal LUNGS: clear to auscultation and percussion with normal breathing effort HEART: regular rate & rhythm and no murmurs and no lower extremity edema ABDOMEN:abdomen soft, non-tender and normal bowel sounds Musculoskeletal:no cyanosis of digits and no clubbing  PSYCH: alert & oriented x 3 with fluent speech NEURO: no focal motor/sensory  deficits  LABORATORY DATA:  I have reviewed the data as listed Lab Results  Component Value Date   WBC 7.2 05/14/2018   HGB 15.0 05/14/2018   HCT 50.8 05/14/2018   MCV 69.6 (L) 05/14/2018   PLT 190 05/14/2018   No results for input(s): NA, K, CL, CO2, GLUCOSE, BUN, CREATININE, CALCIUM, GFRNONAA, GFRAA, PROT, ALBUMIN, AST, ALT, ALKPHOS, BILITOT, BILIDIR, IBILI in the last 8760 hours.

## 2024-06-08 NOTE — Assessment & Plan Note (Signed)
 The patient has chronic microcytosis with elevated RBC count and low MCV, most consistent with undiagnosed thalassemia trait I will order additional workup We discussed inheritance pattern of thalassemia He does not need future follow-up I will call him with test results

## 2024-06-11 LAB — HGB FRACTIONATION CASCADE
Hgb A2: 2.2 % (ref 1.8–3.2)
Hgb A: 97.8 % (ref 96.4–98.8)
Hgb F: 0 % (ref 0.0–2.0)
Hgb S: 0 %

## 2024-06-24 LAB — ALPHA-THALASSEMIA ANALYSIS: IMAGE: 0

## 2024-06-25 ENCOUNTER — Telehealth: Payer: Self-pay

## 2024-06-25 NOTE — Telephone Encounter (Signed)
Called and given below message. He verbalized understanding. 

## 2024-06-25 NOTE — Telephone Encounter (Signed)
-----   Message from Almarie Bedford, MD sent at 06/25/2024 10:57 AM EST ----- Please call him Test results confirmed thalassemia as discussed No need follow-up
